# Patient Record
Sex: Female | Born: 1990 | Race: Black or African American | Hispanic: No | Marital: Single | Smoking: Never smoker
Health system: Southern US, Community
[De-identification: ages and names within clinical notes are randomized; demographics above are authoritative.]

## PROBLEM LIST (undated history)

## (undated) DIAGNOSIS — D6859 Other primary thrombophilia: Secondary | ICD-10-CM

## (undated) DIAGNOSIS — Z86718 Personal history of other venous thrombosis and embolism: Secondary | ICD-10-CM

## (undated) HISTORY — PX: TONSILLECTOMY: SUR1361

## (undated) HISTORY — PX: WISDOM TOOTH EXTRACTION: SHX21

---

## 2017-02-14 ENCOUNTER — Ambulatory Visit (INDEPENDENT_AMBULATORY_CARE_PROVIDER_SITE_OTHER): Payer: Self-pay | Admitting: Physician Assistant

## 2017-07-11 ENCOUNTER — Other Ambulatory Visit: Payer: Self-pay

## 2017-07-11 ENCOUNTER — Encounter (HOSPITAL_COMMUNITY): Payer: Self-pay | Admitting: Emergency Medicine

## 2017-07-11 ENCOUNTER — Ambulatory Visit (HOSPITAL_COMMUNITY)
Admission: EM | Admit: 2017-07-11 | Discharge: 2017-07-11 | Disposition: A | Discharge status: Home / Self Care | Payer: Federal, State, Local not specified - PPO | Attending: Internal Medicine | Admitting: Internal Medicine

## 2017-07-11 DIAGNOSIS — R69 Illness, unspecified: Secondary | ICD-10-CM

## 2017-07-11 DIAGNOSIS — J111 Influenza due to unidentified influenza virus with other respiratory manifestations: Secondary | ICD-10-CM

## 2017-07-11 HISTORY — DX: Personal history of other venous thrombosis and embolism: Z86.718

## 2017-07-11 HISTORY — DX: Other primary thrombophilia: D68.59

## 2017-07-11 MED ORDER — ACETAMINOPHEN 325 MG PO TABS
ORAL_TABLET | ORAL | Status: AC
  Filled 2017-07-11: qty 2

## 2017-07-11 MED ORDER — OSELTAMIVIR PHOSPHATE 75 MG PO CAPS: 75.0000 mg | ORAL_CAPSULE | Freq: Two times a day (BID) | ORAL | 0 refills | Status: AC

## 2017-07-11 MED ORDER — ACETAMINOPHEN 325 MG PO TABS
650.0000 mg | ORAL_TABLET | Freq: Once | ORAL | Status: AC
  Administered 2017-07-11: 650 mg via ORAL

## 2017-07-11 MED ORDER — ONDANSETRON 4 MG PO TBDP
ORAL_TABLET | ORAL | Status: AC
  Filled 2017-07-11: qty 1

## 2017-07-11 MED ORDER — ONDANSETRON 4 MG PO TBDP
4.0000 mg | ORAL_TABLET | Freq: Once | ORAL | Status: AC
  Administered 2017-07-11: 4 mg via ORAL

## 2017-07-11 MED ORDER — ONDANSETRON HCL 4 MG PO TABS: 4.0000 mg | ORAL_TABLET | Freq: Three times a day (TID) | ORAL | 0 refills | Status: AC | PRN

## 2017-07-11 NOTE — ED Triage Notes (Signed)
Onset Sunday of symptoms .  [facility].  Patient was given promethazine.  On Sunday felt body aches and cough.  Yesterday symptoms worsened.  Cough makes chest sore.  Vomited one time today, retching.  Hot and cold flashes.

## 2017-07-11 NOTE — Discharge Instructions (Signed)
Push fluids to ensure adequate hydration and keep secretions thin.  Tylenol as needed for pain or fevers.  Zofran as needed for nausea, every 8 hours. May use cough syrup previously prescribed as needed, may cause drowsiness. Robitussin as needed.  5 days of tamiflu, may cause stomach upset.  If you develop increased chest pain, shortness of breath, difficulty breathing, pain to legs/calfs, or otherwise worsening please go to Er. Please follow up with your primary care provider in the next week for recheck of symptoms.

## 2017-07-11 NOTE — ED Provider Notes (Signed)
[facility]    CSN: [id number] Arrival date & time: 07/11/17  1023     History   Chief Complaint Chief Complaint  Patient presents with  . URI    HPI Nancy Sandoval is a 27 y.o. female.   Nancy Sandoval presents with complaints of body aches, cough, chills. Started two days ago but worsened yesterday. Her friend was just diagnosed with influenza. Vomited x1 this morning. Still with mild nausea. Body aches 10/10. Took tylenol last last night. Was prescribed promethazine cough syrup from her health clinic at school it minimally helped, took this morning. Mild sore throat and runny nose. Did not get a flu vaccine. Denies chest pain, palpitations, leg pain or swelling. History of protein c deficiency and is on xarelto for this. Without asthma history.    ROS per HPI.       Past Medical History:  Diagnosis Date  . 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 Protein C deficiency (HCC)     There are no active problems to display for this patient.   Past Surgical History:  Procedure Laterality Date  . TONSILLECTOMY    . WISDOM TOOTH EXTRACTION      OB History    No data available       Home Medications    Prior to Admission medications   Medication Sig Start Date End Date Taking? Authorizing Provider  acetaminophen (TYLENOL) 500 MG tablet Take 500 mg by mouth every 6 (six) hours as needed.   Yes [provider]  norethindrone (MICRONOR,CAMILA,ERRIN) 0.35 MG tablet Take 1 tablet by mouth daily.   Yes [provider]  Rivaroxaban (XARELTO PO) Take by mouth.   Yes [provider]  topiramate (TOPAMAX) 100 MG tablet Take by mouth 2 (two) times daily.   Yes [provider]  ondansetron (ZOFRAN) 4 MG tablet Take 1 tablet (4 mg total) by mouth every 8 (eight) hours as needed for nausea or vomiting. 07/11/17   Georgetta HaberBurky, Natalie B, NP  oseltamivir (TAMIFLU) 75 MG capsule Take 1 capsule (75 mg total) by mouth every 12 (twelve) hours for 5 days.  07/11/17 07/16/17  Georgetta HaberBurky, Natalie B, NP    Family History Family History  Problem Relation Age of Onset  . Protein C deficiency Brother     Social History Social History   Tobacco Use  . Smoking status: Never Smoker  Substance Use Topics  . Alcohol use: Yes  . Drug use: No     Allergies   Amoxicillin; Estrogens; and Penicillins   Review of Systems Review of Systems   Physical Exam Triage Vital Signs ED Triage Vitals  Enc Vitals Group     BP 07/11/17 1121 (!) 109/56     Pulse Rate 07/11/17 1121 (!) 117     Resp 07/11/17 1121 20     Temp 07/11/17 1121 (!) 100.4 F (38 C)     Temp Source 07/11/17 1121 Oral     SpO2 07/11/17 1121 100 %     Weight --      Height --      Head Circumference --      Peak Flow --      Pain Score 07/11/17 1111 10     Pain Loc --      Pain Edu? --      Excl. in GC? --    No data found.  Updated Vital Signs BP (!) 109/56 (BP Location: Left Arm) Comment: large cuff  Pulse (!) 117   Temp (!) 100.4 F (38 C) (Oral)   Resp 20   LMP 07/04/2017   SpO2 100%   Visual Acuity Right Eye Distance:   Left Eye Distance:   Bilateral Distance:    Right Eye Near:   Left Eye Near:    Bilateral Near:     Physical Exam  Constitutional: She is oriented to person, place, and time. She appears well-developed and well-nourished. She appears ill. No distress.  HENT:  Head: Normocephalic and atraumatic.  Right Ear: Tympanic membrane, external ear and ear canal normal.  Left Ear: Tympanic membrane, external ear and ear canal normal.  Nose: Nose normal.  Mouth/Throat: Uvula is midline, oropharynx is clear and moist and mucous membranes are normal. No tonsillar exudate.  Eyes: Conjunctivae and EOM are normal. Pupils are equal, round, and reactive to light.  Cardiovascular: Regular rhythm and normal heart sounds. Tachycardia present.  Pulmonary/Chest: Effort normal and breath sounds normal.  Without cough during exam  Abdominal: There is no  tenderness.  Neurological: She is alert and oriented to person, place, and time.  Skin: Skin is warm and dry.     UC Treatments / Results  Labs (all labs ordered are listed, but only abnormal results are displayed) Labs Reviewed - No data to display  EKG  EKG Interpretation None       Radiology No results found.  Procedures Procedures (including critical care time)  Medications Ordered in UC Medications  ondansetron (ZOFRAN-ODT) disintegrating tablet 4 mg (not administered)  acetaminophen (TYLENOL) tablet 650 mg (650 mg Oral Given 07/11/17 1131)     Initial Impression / Assessment and Plan / UC Course  I have reviewed the triage vital signs and the nursing notes.  Pertinent labs & imaging results that were available during my care of the patient were reviewed by me and considered in my medical decision making (see chart for details).     Lungs clear at this time. Non toxic yet ill appearing. She is on xarelto, low suspicion for PE at this time. Flu exposure and no flu shot this season. tamiflu provided at this time. zofran as needed for nausea. Discussed return precautions at length. Encouraged follow up with PCP for recheck. Discussed course of illness expectations. Patient verbalized understanding and agreeable to plan.    Final Clinical Impressions(s) / UC Diagnoses   Final diagnoses:  Influenza-like illness    ED Discharge Orders        Ordered    oseltamivir (TAMIFLU) 75 MG capsule  Every 12 hours     07/11/17 1158    ondansetron (ZOFRAN) 4 MG tablet  Every 8 hours PRN     07/11/17 1158       Controlled Substance Prescriptions [place] Controlled Substance Registry consulted? Not Applicable   Georgetta Haber, NP 07/11/17 1210

## 2018-10-19 ENCOUNTER — Inpatient Hospital Stay (HOSPITAL_COMMUNITY)
Admission: AD | Admit: 2018-10-19 | Discharge: 2018-10-20 | Disposition: A | Discharge status: Home / Self Care | Payer: Self-pay | Attending: Emergency Medicine | Admitting: Emergency Medicine

## 2018-10-19 ENCOUNTER — Other Ambulatory Visit: Payer: Self-pay

## 2018-10-19 DIAGNOSIS — R1011 Right upper quadrant pain: Secondary | ICD-10-CM

## 2018-10-19 DIAGNOSIS — Z79899 Other long term (current) drug therapy: Secondary | ICD-10-CM | POA: Insufficient documentation

## 2018-10-19 DIAGNOSIS — K769 Liver disease, unspecified: Secondary | ICD-10-CM

## 2018-10-19 DIAGNOSIS — K7689 Other specified diseases of liver: Secondary | ICD-10-CM | POA: Insufficient documentation

## 2018-10-19 DIAGNOSIS — Z7901 Long term (current) use of anticoagulants: Secondary | ICD-10-CM | POA: Insufficient documentation

## 2018-10-19 NOTE — MAU Note (Signed)
Pain R mid side since 1700 that goes back to R flank. Worse when laying down and after she ate shrimp. Worse with walking and bending. Hurts to lay on R side. Pain started like crampy pain and then changed to stabbing pain. States is not pregnant

## 2018-10-20 ENCOUNTER — Inpatient Hospital Stay (HOSPITAL_COMMUNITY): Payer: Self-pay

## 2018-10-20 ENCOUNTER — Other Ambulatory Visit: Payer: Self-pay

## 2018-10-20 ENCOUNTER — Encounter (HOSPITAL_COMMUNITY): Payer: Self-pay | Admitting: Oncology

## 2018-10-20 LAB — CBC WITH DIFFERENTIAL/PLATELET
Abs Immature Granulocytes: 0.3 10*3/uL — ABNORMAL HIGH (ref 0.00–0.07)
Basophils Absolute: 0 10*3/uL (ref 0.0–0.1)
Basophils Relative: 0 %
Eosinophils Absolute: 0 10*3/uL (ref 0.0–0.5)
Eosinophils Relative: 0 %
HCT: 34 % — ABNORMAL LOW (ref 36.0–46.0)
Hemoglobin: 10.4 g/dL — ABNORMAL LOW (ref 12.0–15.0)
Lymphocytes Relative: 33 %
Lymphs Abs: 4.1 10*3/uL — ABNORMAL HIGH (ref 0.7–4.0)
MCH: 24.1 pg — ABNORMAL LOW (ref 26.0–34.0)
MCHC: 30.6 g/dL (ref 30.0–36.0)
MCV: 78.7 fL — ABNORMAL LOW (ref 80.0–100.0)
Metamyelocytes Relative: 2 %
Monocytes Absolute: 1.1 10*3/uL — ABNORMAL HIGH (ref 0.1–1.0)
Monocytes Relative: 9 %
Neutro Abs: 7 10*3/uL (ref 1.7–7.7)
Neutrophils Relative %: 56 %
Platelets: 342 10*3/uL (ref 150–400)
RBC: 4.32 MIL/uL (ref 3.87–5.11)
RDW: 15.7 % — ABNORMAL HIGH (ref 11.5–15.5)
WBC: 12.5 10*3/uL — ABNORMAL HIGH (ref 4.0–10.5)
nRBC: 0 % (ref 0.0–0.2)

## 2018-10-20 LAB — COMPREHENSIVE METABOLIC PANEL
ALT: 22 U/L (ref 0–44)
AST: 24 U/L (ref 15–41)
Albumin: 3.5 g/dL (ref 3.5–5.0)
Alkaline Phosphatase: 56 U/L (ref 38–126)
Anion gap: 11 (ref 5–15)
BUN: 7 mg/dL (ref 6–20)
CO2: 23 mmol/L (ref 22–32)
Calcium: 9.4 mg/dL (ref 8.9–10.3)
Chloride: 103 mmol/L (ref 98–111)
Creatinine, Ser: 0.55 mg/dL (ref 0.44–1.00)
GFR calc Af Amer: >60 mL/min (ref >60)
GFR calc non Af Amer: >60 mL/min (ref >60)
Glucose, Bld: 108 mg/dL — ABNORMAL HIGH (ref 70–99)
Potassium: 3.8 mmol/L (ref 3.5–5.1)
Sodium: 137 mmol/L (ref 135–145)
Total Bilirubin: 0.3 mg/dL (ref 0.3–1.2)
Total Protein: 6.7 g/dL (ref 6.5–8.1)

## 2018-10-20 LAB — URINALYSIS, ROUTINE W REFLEX MICROSCOPIC
Bilirubin Urine: NEGATIVE
Glucose, UA: NEGATIVE mg/dL
Hgb urine dipstick: NEGATIVE
Ketones, ur: NEGATIVE mg/dL
Leukocytes,Ua: NEGATIVE
Nitrite: NEGATIVE
Protein, ur: NEGATIVE mg/dL
Specific Gravity, Urine: 1.023 (ref 1.005–1.030)
pH: 5 (ref 5.0–8.0)

## 2018-10-20 LAB — LIPASE, BLOOD: Lipase: 44 U/L (ref 11–51)

## 2018-10-20 LAB — PREGNANCY, URINE: Preg Test, Ur: NEGATIVE

## 2018-10-20 MED ORDER — PANTOPRAZOLE SODIUM 40 MG IV SOLR
40.0000 mg | Freq: Once | INTRAVENOUS | Status: AC
  Administered 2018-10-20: 40 mg via INTRAVENOUS
  Filled 2018-10-20: qty 40

## 2018-10-20 MED ORDER — FENTANYL CITRATE (PF) 100 MCG/2ML IJ SOLN
50.0000 ug | Freq: Once | INTRAMUSCULAR | Status: AC
  Administered 2018-10-20: 50 ug via INTRAVENOUS
  Filled 2018-10-20: qty 2

## 2018-10-20 MED ORDER — PANTOPRAZOLE SODIUM 20 MG PO TBEC: 40.0000 mg | DELAYED_RELEASE_TABLET | Freq: Every day | ORAL | 0 refills | Status: AC

## 2018-10-20 MED ORDER — METOCLOPRAMIDE HCL 5 MG/ML IJ SOLN
10.0000 mg | INTRAMUSCULAR | Status: AC
  Administered 2018-10-20: 10 mg via INTRAVENOUS
  Filled 2018-10-20: qty 2

## 2018-10-20 NOTE — ED Notes (Signed)
Patient transported to Ultrasound 

## 2018-10-20 NOTE — MAU Note (Addendum)
[place] CN, Clydie Braun,  notified that pt will come over for appendicitis workup. Pt may come to admissions at ED

## 2018-10-20 NOTE — MAU Provider Note (Signed)
None     [name] is a 28 y.o. non-pregnant female who presents to MAU today with complaint of sudden onset RLQ abdominal pain today. The pain is worse when lying on her right side, not improved by Tylenol or Tums.  It is sharp pain that is constant and radiates to her right lower back.  There are no other associated symptoms.    O BP 122/66 (BP Location: Right Arm)   Pulse 77   Temp 98.5 F (36.9 C)   Resp 18   Ht 5\' 3"  (1.6 m)   Wt 109.8 kg   LMP 09/29/2018   BMI 42.87 kg/m  Physical Exam  Nursing note and vitals reviewed. Constitutional: She is oriented to person, place, and time. She appears well-developed and well-nourished.  Neck: Normal range of motion.  Cardiovascular: Normal rate.  Respiratory: Effort normal.  GI: Soft.  Musculoskeletal: Normal range of motion.  Neurological: She is alert and oriented to person, place, and time.  Skin: Skin is warm and dry.  Psychiatric: She has a normal mood and affect. Her behavior is normal. Judgment and thought content normal.    A Non pregnant female Medical screening exam complete RLQ abdominal pain  P Discharge from MAU in stable condition Patient given the option of transfer to [facility] for further evaluation or seek care in outpatient facility of choice List of options for follow-up given  Pt prefers evaluation in emergency room tonight Called ED Charge nurse and ED provider to notify of pt coming to ED per pt choice Staff walked pt to ED since she is unfamiliar with [facility] Pt stable when leaving MAU Patient may return to MAU as needed for pregnancy related complaints  [place]-Kirby, Wilmer Floor, CNM 10/20/2018 1:21 AM

## 2018-10-20 NOTE — Progress Notes (Signed)
Collene Gobble -Craige Cotta CNM on unit and aware of pt's Triage status. Will see pt in Triage. Pt aware

## 2018-10-20 NOTE — ED Provider Notes (Signed)
[facility] Provider Note   CSN: [id number] Arrival date & time: 10/19/18  2335    History   Chief Complaint Chief Complaint  Patient presents with  . Flank Pain    HPI Nancy Sandoval is a 28 y.o. female.     28 year old female presents to the emergency department for right upper quadrant abdominal pain.  States the pain began between 16 and 1700 tonight.  It has been constant, but waxing and waning in severity.  She initially felt that pain was improving with Tylenol, but then symptoms worsened when she was eating dinner tonight.  She also tried Tums with no relief.  Patient denies fevers, nausea, vomiting, diarrhea, melena, hematochezia, dysuria, hematuria, vaginal bleeding, vaginal discharge.  Last menstrual period was earlier this month.  She has no history of abdominal surgeries.  No sick contacts.  The history is provided by the patient. No language interpreter was used.  Flank Pain     Past Medical History:  Diagnosis Date  . 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 Protein C deficiency (HCC)     There are no active problems to display for this patient.   Past Surgical History:  Procedure Laterality Date  . TONSILLECTOMY    . WISDOM TOOTH EXTRACTION       OB History   No obstetric history on file.      Home Medications    Prior to Admission medications   Medication Sig Start Date End Date Taking? Authorizing Provider  loratadine (CLARITIN) 10 MG tablet Take 10 mg by mouth daily. 08/02/18  Yes [provider]  topiramate (TOPAMAX) 50 MG tablet Take 50 mg by mouth at bedtime. 08/02/18  Yes [provider]  XARELTO 20 MG TABS tablet Take 20 mg by mouth daily. 08/02/18  Yes [provider]  ondansetron (ZOFRAN) 4 MG tablet Take 1 tablet (4 mg total) by mouth every 8 (eight) hours as needed for nausea or vomiting. Patient not taking: Reported on 10/20/2018 07/11/17   Linus MakoBurky, Natalie B, NP  pantoprazole  (PROTONIX) 20 MG tablet Take 2 tablets (40 mg total) by mouth daily. 10/20/18   Antony MaduraHumes, Georgiann Neider, PA-C    Family History Family History  Problem Relation Age of Onset  . Protein C deficiency Brother     Social History Social History   Tobacco Use  . Smoking status: Never Smoker  . Smokeless tobacco: Never Used  Substance Use Topics  . Alcohol use: Yes  . Drug use: No     Allergies   Amoxicillin; Estrogens; and Penicillins   Review of Systems Review of Systems  Genitourinary: Positive for flank pain.  Ten systems reviewed and are negative for acute change, except as noted in the HPI.    Physical Exam Updated Vital Signs BP 106/63 (BP Location: Left Arm)   Pulse 89   Temp 98.3 F (36.8 C) (Oral)   Resp 20   Ht 5\' 3"  (1.6 m)   Wt 109 kg   LMP 09/29/2018   SpO2 97%   BMI 42.57 kg/m   Physical Exam Vitals signs and nursing note reviewed.  Constitutional:      General: She is not in acute distress.    Appearance: She is well-developed. She is not diaphoretic.     Comments: Nontoxic appearing and in NAD  HENT:     Head: Normocephalic and atraumatic.  Eyes:     General: No scleral icterus.    Conjunctiva/sclera:  Conjunctivae normal.  Neck:     Musculoskeletal: Normal range of motion.  Cardiovascular:     Rate and Rhythm: Normal rate and regular rhythm.     Pulses: Normal pulses.  Pulmonary:     Effort: Pulmonary effort is normal. No respiratory distress.     Comments: Respirations even and unlabored Abdominal:     Comments: Epigastric and RUQ TTP with voluntary guarding. Negative Murphy's sign. Abdomen soft, obese. No masses or peritoneal signs.  Musculoskeletal: Normal range of motion.  Skin:    General: Skin is warm and dry.     Coloration: Skin is not pale.     Findings: No erythema or rash.  Neurological:     General: No focal deficit present.     Mental Status: She is alert and oriented to person, place, and time.     Coordination: Coordination  normal.     Comments: GCS 15. Patient moving all extremities spontaneously.  Psychiatric:        Behavior: Behavior normal.      ED Treatments / Results  Labs (all labs ordered are listed, but only abnormal results are displayed) Labs Reviewed  CBC WITH DIFFERENTIAL/PLATELET - Abnormal; Notable for the following components:      Result Value   WBC 12.5 (*)    Hemoglobin 10.4 (*)    HCT 34.0 (*)    MCV 78.7 (*)    MCH 24.1 (*)    RDW 15.7 (*)    Lymphs Abs 4.1 (*)    Monocytes Absolute 1.1 (*)    Abs Immature Granulocytes 0.30 (*)    All other components within normal limits  COMPREHENSIVE METABOLIC PANEL - Abnormal; Notable for the following components:   Glucose, Bld 108 (*)    All other components within normal limits  URINALYSIS, ROUTINE W REFLEX MICROSCOPIC  PREGNANCY, URINE  LIPASE, BLOOD  PATHOLOGIST SMEAR REVIEW    EKG None  Radiology US Abdomen Limited  Result Date: 10/20/2018 CLINICAL DATA:  Right upper quadrant pain EXAM: ULTRASOUND ABDOMEN LIMITED RIGHT UPPER QUADRANT COMPARISON:  None. FINDINGS: Gallbladder: No gallstones or wall thickening visualized. No sonographic Murphy sign noted by sonographer. Common bile duct: Diameter: 2.4 mm Liver: Increased hepatic echogenicity. Multiple hypoechoic solid-appearing masses. Left hepatic lobe mass measures 6.5 x 4 x 6.4 cm. Right posterior hepatic lobe mass measures 2.8 x 2.5 by 4.2 cm. Smaller hypoechoic mass in the right hepatic lobe measuring 2.1 x 1.6 x 1.9 cm. Portal vein is patent on color Doppler imaging with normal direction of blood flow towards the liver. IMPRESSION: 1. Negative for gallstones or biliary dilatation 2. Echogenic liver suggesting steatosis. 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 Further evaluation with nonemergent MRI and or previous imaging is recommended Electronically Signed   By: Jasmine Pang  M.D.   On: 10/20/2018 03:55    Procedures Procedures (including critical care time)  Medications Ordered in ED Medications  pantoprazole (PROTONIX) injection 40 mg (40 mg Intravenous Given 10/20/18 0238)  fentaNYL (SUBLIMAZE) injection 50 mcg (50 mcg Intravenous Given 10/20/18 0238)  metoCLOPramide (REGLAN) injection 10 mg (10 mg Intravenous Given 10/20/18 0238)    5:10 AM  patient reassessed.  She is sleeping and in no distress.  Upon waking, states that her pain has subsided some.   Initial Impression / Assessment and Plan / ED Course  I have reviewed the triage vital signs and the nursing  notes.  Pertinent labs & imaging results that were available during my care of the patient were reviewed by me and considered in my medical decision making (see chart for details).        28 year old female presents to the emergency department for complaints of right-sided abdominal pain which began between 1600-1700 tonight.  Pain constant, unrelieved with Tylenol.  She has not had any nausea, vomiting, diarrhea.  No urinary symptoms.  Patient noted to have nonspecific leukocytosis.  Anemia compared to labs on care everywhere.  This appears stable.  No electrolyte derangements.  Liver and kidney function preserved.  Lipase normal without evidence of pancreatitis.  Pregnancy negative.  Urinalysis without findings consistent with UTI.  Also no hematuria to suggest kidney stone.  Patient further underwent right upper quadrant ultrasound to assess her gallbladder.  This is negative for gallstones, wall thickening, pericholecystic fluid.  She does have lesions to her liver, but alleged history of FNH.  Care everywhere also with MRI of the liver in 2019.  Liver lesions were present at this time as well.  I do not believe this is contributing to the patient's symptoms.  Given symptomatic improvement in the emergency department with reassuring work-up, patient stable for repeat assessment by her PCP.  She may  benefit from GI follow-up, but seeks most of her care in [place] and would like to retrieve the referral from her PCP.  Gastritis considered given epigastric tenderness.  Will discharge with Protonix.  Return precautions discussed and provided. Patient discharged in stable condition with no unaddressed concerns.   Final Clinical Impressions(s) / ED Diagnoses   Final diagnoses:  Right upper quadrant abdominal pain  Lesion of liver    ED Discharge Orders         Ordered    pantoprazole (PROTONIX) 20 MG tablet  Daily     10/20/18 0526           Antony Madura, PA-C 10/20/18 0929    Gilda Crease, MD 10/20/18 [phone]

## 2018-10-20 NOTE — ED Triage Notes (Signed)
Pt c/o right flank pain that radiates to right groin. Pain started yesterday at approximately 1700.  Pt reports pain got worse after eating.  Pt took tylenol and tums w/o relief.

## 2018-10-20 NOTE — Discharge Instructions (Signed)
Your work-up in the emergency department today was reassuring.  We recommend follow-up with your primary care doctor.  You may benefit from follow-up with a GI doctor as well.  You can be referred to the specialist by your primary physician.  Take Protonix as prescribed to try and help prevent recurrence of pain.  While you may continue to use medications such as ibuprofen or Aleve, try to use these sparingly as this can cause stomach irritation if used often.  You may return to the ED for new or concerning symptoms.

## 2018-10-22 LAB — PATHOLOGIST SMEAR REVIEW

## 2021-01-14 IMAGING — US ULTRASOUND ABDOMEN LIMITED
1 series · 14 of 25 positions shown · non-contrast
Comparison: None.

CLINICAL DATA: Right upper quadrant pain

EXAM:
ULTRASOUND ABDOMEN LIMITED RIGHT UPPER QUADRANT

[Series 1: ultrasound abdomen limited · 14 of 60 slices shown]
[im 1/60]
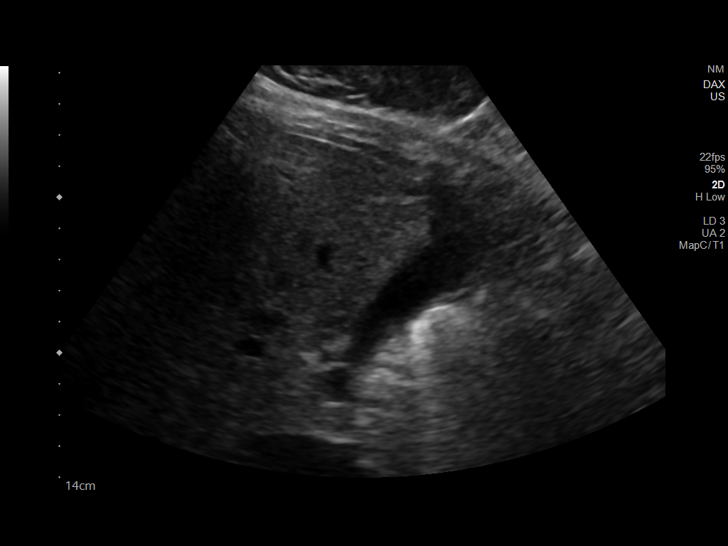
[im 5/60]
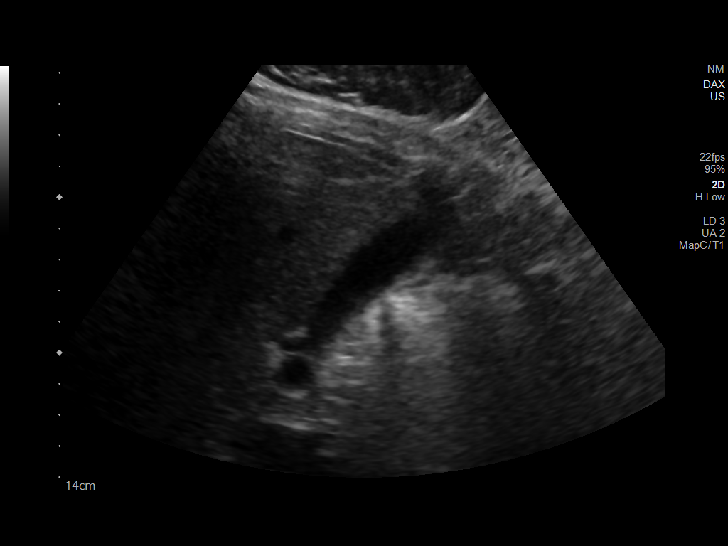
[im 10/60]
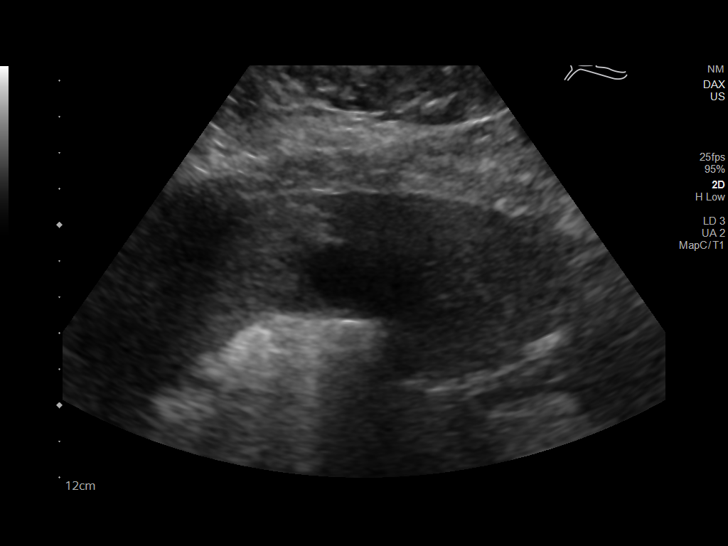
[im 15/60]
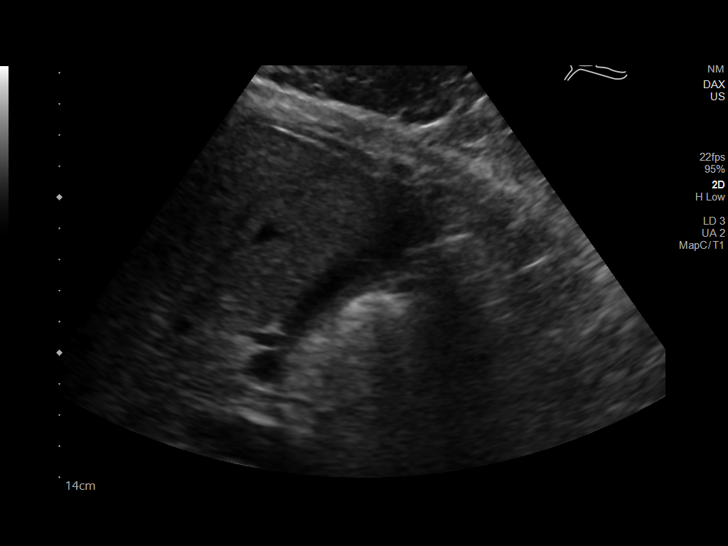
[im 20/60]
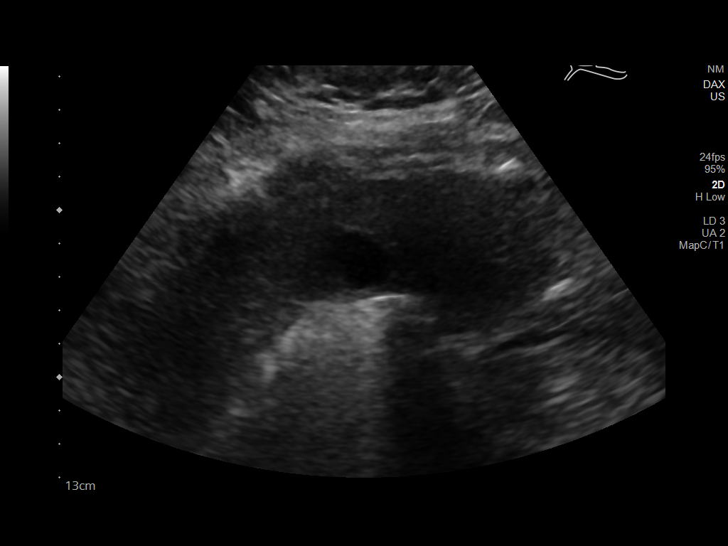
[im 23/60]
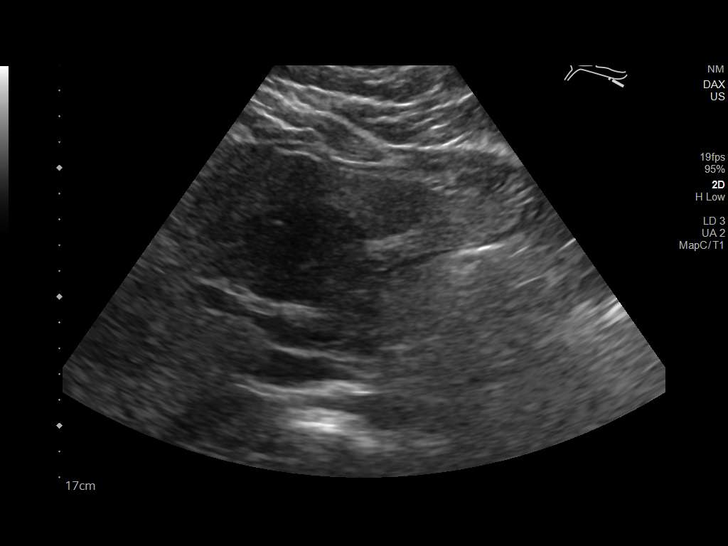
[im 28/60]
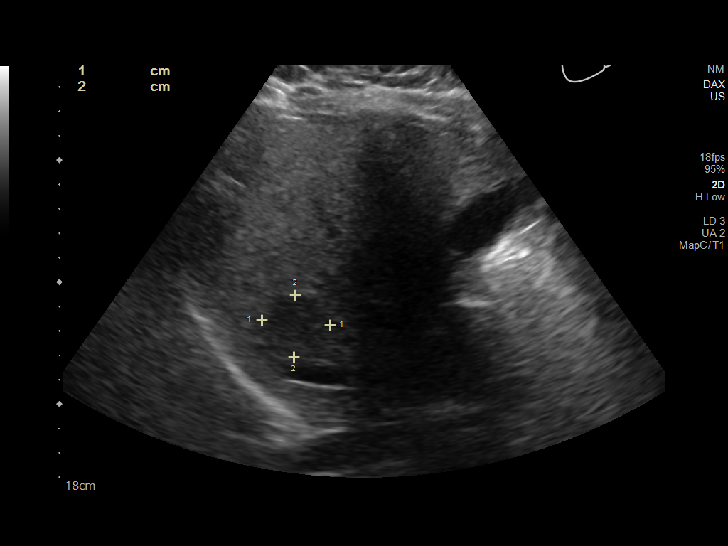
[im 32/60]
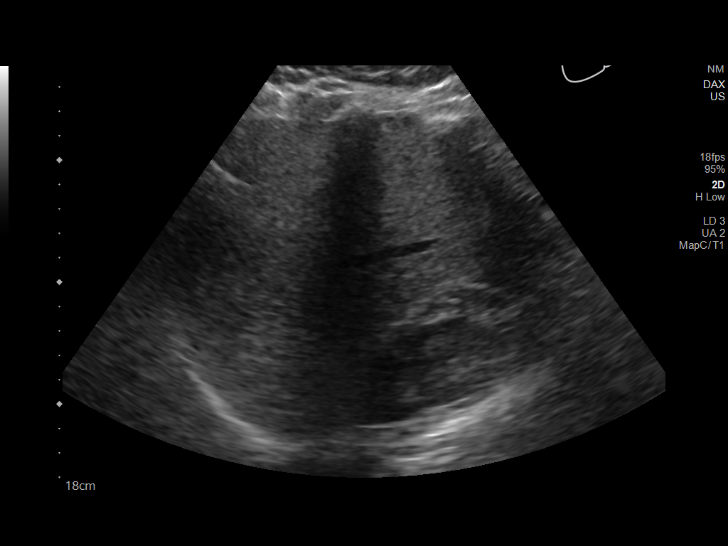
[im 37/60]
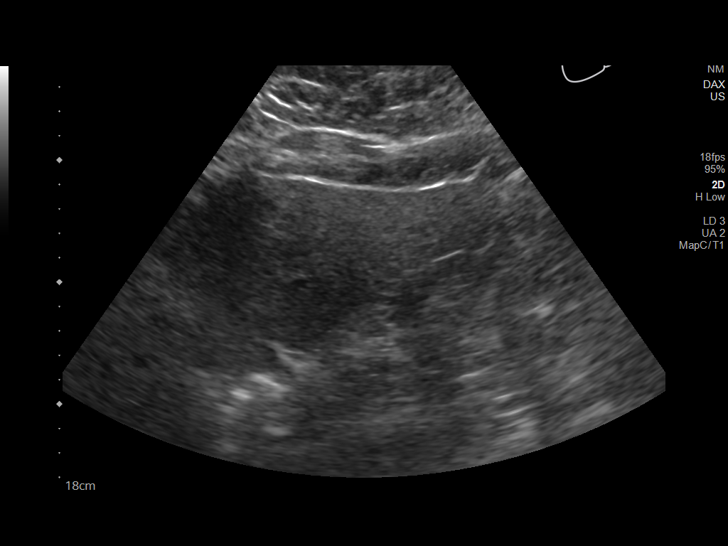
[im 40/60]
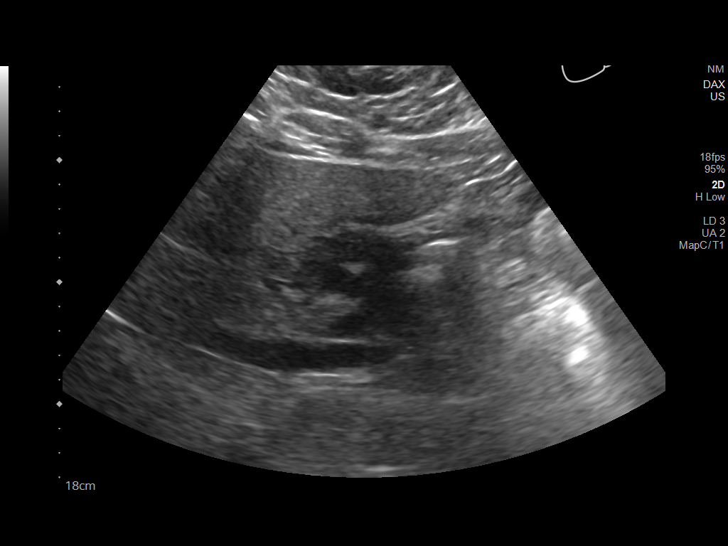
[im 45/60]
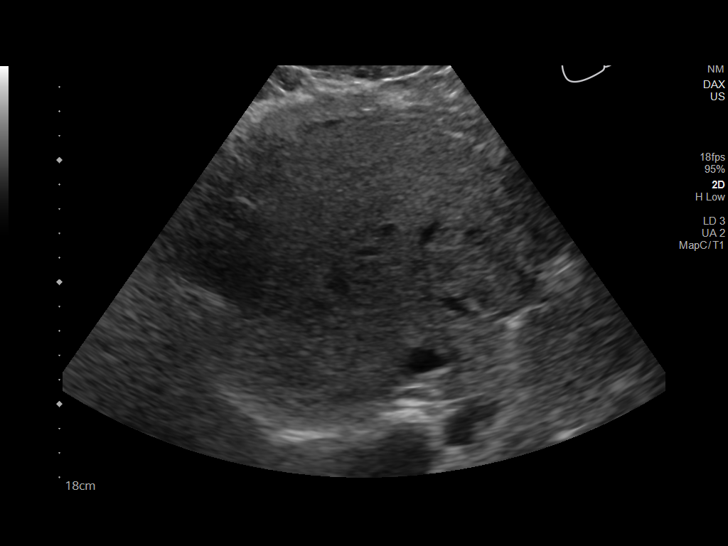
[im 50/60]
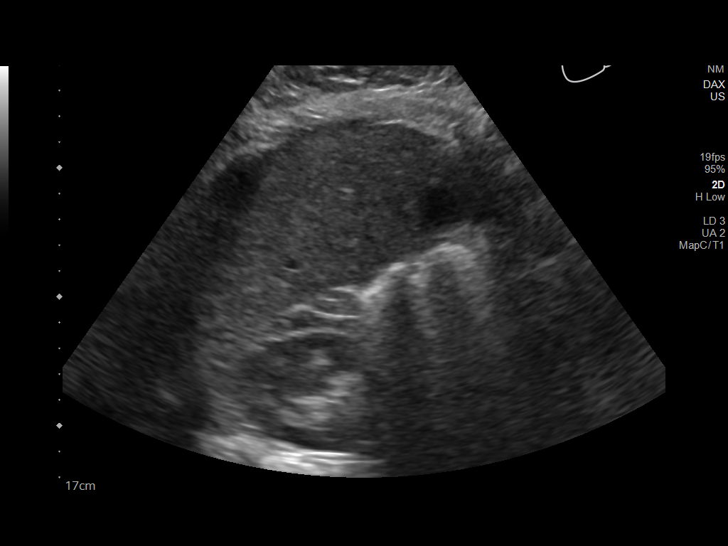
[im 55/60]
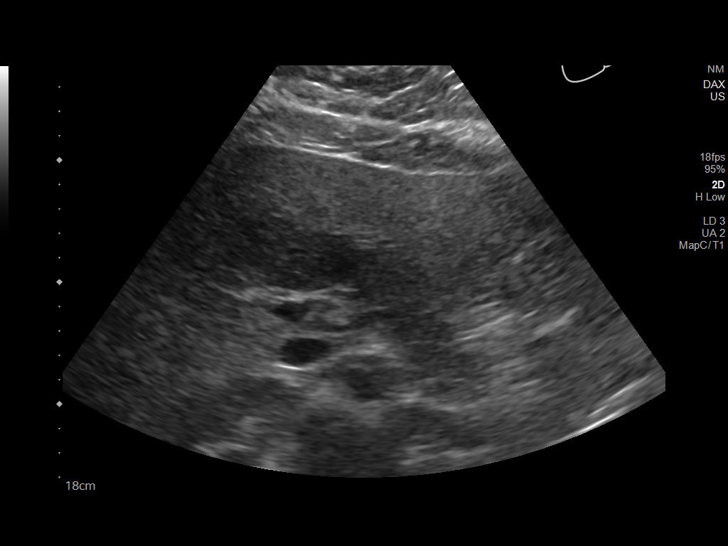
[im 60/60]
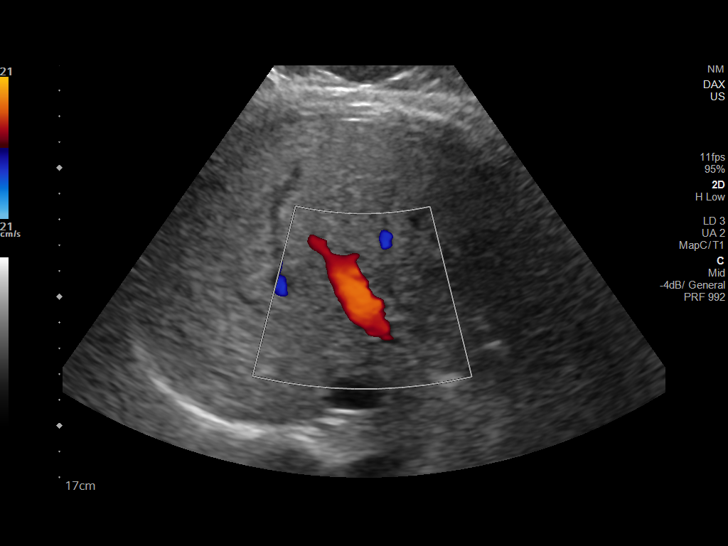

[14 of 25 positions shown; findings below may reference images not displayed]

FINDINGS: Gallbladder:

No gallstones or wall thickening visualized. No sonographic Murphy
sign noted by sonographer.

Common bile duct:

Diameter: 2.4 mm

Liver:

Increased hepatic echogenicity. Multiple hypoechoic solid-appearing
masses. Left hepatic lobe mass measures 6.5 x 4 x 6.4 cm. Right
posterior hepatic lobe mass measures 2.8 x 2.5 by 4.2 cm. Smaller
hypoechoic mass in the right hepatic lobe measuring 2.1 x 1.6 x
cm. Portal vein is patent on color Doppler imaging with normal
direction of blood flow towards the liver.
IMPRESSION: 1. Negative for gallstones or biliary dilatation
2. Echogenic liver suggesting steatosis. Multiple hypoechoic solid
masses in the liver, patient reportedly has history of FNH but there
are no previous exams available for comparison. Further evaluation
with nonemergent MRI and or previous imaging is recommended
